# Patient Record
Sex: Female | Born: 1955 | Race: White | Hispanic: No | Marital: Married | Smoking: Never smoker
Health system: Southern US, Community
[De-identification: ages and names within clinical notes are randomized; demographics above are authoritative.]

## PROBLEM LIST (undated history)

## (undated) DIAGNOSIS — E7800 Pure hypercholesterolemia, unspecified: Secondary | ICD-10-CM

## (undated) HISTORY — PX: CHOLECYSTECTOMY: SHX55

---

## 2004-09-05 ENCOUNTER — Ambulatory Visit: Payer: Self-pay | Admitting: Internal Medicine

## 2007-09-23 ENCOUNTER — Ambulatory Visit: Payer: Self-pay | Admitting: Family Medicine

## 2007-11-29 ENCOUNTER — Ambulatory Visit: Payer: Self-pay | Admitting: Gastroenterology

## 2008-09-29 ENCOUNTER — Ambulatory Visit: Payer: Self-pay | Admitting: Internal Medicine

## 2009-09-30 ENCOUNTER — Ambulatory Visit: Payer: Self-pay | Admitting: Internal Medicine

## 2010-10-13 ENCOUNTER — Ambulatory Visit: Payer: Self-pay | Admitting: Internal Medicine

## 2011-02-03 ENCOUNTER — Ambulatory Visit: Payer: Self-pay | Admitting: Gastroenterology

## 2011-10-25 ENCOUNTER — Ambulatory Visit: Payer: Self-pay | Admitting: Internal Medicine

## 2011-11-07 ENCOUNTER — Ambulatory Visit: Payer: Self-pay | Admitting: Internal Medicine

## 2013-06-17 ENCOUNTER — Ambulatory Visit: Payer: Self-pay | Admitting: Nurse Practitioner

## 2014-07-09 ENCOUNTER — Ambulatory Visit: Payer: Self-pay | Admitting: Nurse Practitioner

## 2014-07-20 ENCOUNTER — Ambulatory Visit: Payer: Self-pay | Admitting: Nurse Practitioner

## 2015-11-16 ENCOUNTER — Encounter: Payer: Self-pay | Admitting: Gynecology

## 2015-11-16 ENCOUNTER — Ambulatory Visit
Admission: EM | Admit: 2015-11-16 | Discharge: 2015-11-16 | Disposition: A | Discharge status: Home / Self Care | Payer: Managed Care, Other (non HMO) | Attending: Family Medicine | Admitting: Family Medicine

## 2015-11-16 DIAGNOSIS — J020 Streptococcal pharyngitis: Secondary | ICD-10-CM | POA: Diagnosis not present

## 2015-11-16 HISTORY — DX: Pure hypercholesterolemia, unspecified: E78.00

## 2015-11-16 LAB — RAPID INFLUENZA A&B ANTIGENS: Influenza B (ARMC): NEGATIVE

## 2015-11-16 LAB — RAPID INFLUENZA A&B ANTIGENS (ARMC ONLY): INFLUENZA A (ARMC): NEGATIVE

## 2015-11-16 LAB — RAPID STREP SCREEN (MED CTR MEBANE ONLY): Streptococcus, Group A Screen (Direct): POSITIVE — AB

## 2015-11-16 MED ORDER — PENICILLIN G BENZATHINE 1200000 UNIT/2ML IM SUSP
1.2000 10*6.[IU] | Freq: Once | INTRAMUSCULAR | Status: AC
  Administered 2015-11-16: 1.2 10*6.[IU] via INTRAMUSCULAR

## 2015-11-16 NOTE — ED Notes (Signed)
Patient c/o sore throat on and off x 3 days ago. Patient c/o fever and head ache.

## 2015-11-16 NOTE — ED Provider Notes (Signed)
CSN: [id number]     Arrival date & time 11/16/15  1206 History   First MD Initiated Contact with Patient 11/16/15 1314    Nurses notes were reviewed. Chief Complaint  Patient presents with  . Sore Throat    Patient reports Friday sore throat headache and some chills. She's felt miserable since then and is controlling worse progressively. She is not allergic to any medication she is on Zocor for hyperlipidemia. Does not smoke and no pertinent family medical history related to this visit.     (Consider location/radiation/quality/duration/timing/severity/associated sxs/prior Treatment) Patient is a 60 y.o. female presenting with pharyngitis. No language interpreter was used.  Sore Throat This is a new problem. The current episode started more than 2 days ago. The problem occurs constantly. The problem has been gradually worsening. Associated symptoms include headaches. Pertinent negatives include no chest pain, no abdominal pain and no shortness of breath. Nothing aggravates the symptoms. Nothing relieves the symptoms. She has tried nothing for the symptoms.    Past Medical History  Diagnosis Date  . High cholesterol    Past Surgical History  Procedure Laterality Date  . Cholecystectomy     No family history on file. Social History  Substance Use Topics  . Smoking status: Never Smoker   . Smokeless tobacco: None  . Alcohol Use: No   OB History    No data available     Review of Systems  Constitutional: Positive for fever, chills and fatigue.  Respiratory: Negative for shortness of breath.   Cardiovascular: Negative for chest pain.  Gastrointestinal: Negative for abdominal pain.  Musculoskeletal: Positive for myalgias.  Neurological: Positive for headaches.  All other systems reviewed and are negative.   Allergies  Latex  Home Medications   Prior to Admission medications   Medication Sig Start Date End Date Taking? Authorizing Provider  simvastatin (ZOCOR) 5 MG  tablet Take 5 mg by mouth daily.   Yes Historical Provider, MD   Meds Ordered and Administered this Visit   Medications  penicillin g benzathine (BICILLIN LA) 1200000 UNIT/2ML injection 1.2 Million Units (1.2 Million Units Intramuscular Given 11/16/15 1351)    BP 140/74 mmHg  Temp(Src) 99.1 F (37.3 C)  Ht 5\' 4"  (1.626 m)  Wt 170 lb (77.111 kg)  BMI 29.17 kg/m2  SpO2 99% No data found.   Physical Exam  Constitutional: She is oriented to person, place, and time. She appears well-developed and well-nourished.  HENT:  Head: Normocephalic and atraumatic.  Right Ear: Hearing, tympanic membrane, external ear and ear canal normal.  Left Ear: Hearing, tympanic membrane, external ear and ear canal normal.  Nose: Mucosal edema present.  Mouth/Throat: No dental abscesses or dental caries. Posterior oropharyngeal edema and posterior oropharyngeal erythema present.  Eyes: Conjunctivae are normal. Pupils are equal, round, and reactive to light.  Neck: Normal range of motion.  Cardiovascular: Normal rate and regular rhythm.   Pulmonary/Chest: Effort normal.  Musculoskeletal: Normal range of motion.  Neurological: She is oriented to person, place, and time.  Skin: Skin is warm and dry.  Psychiatric: She has a normal mood and affect.  Vitals reviewed.   ED Course  Procedures (including critical care time)  Labs Review Labs Reviewed  RAPID STREP SCREEN (NOT AT [facility]) - Abnormal; Notable for the following:    Streptococcus, Group A Screen (Direct) POSITIVE (*)    All other components within normal limits  RAPID INFLUENZA A&B ANTIGENS ([facility] ONLY)    Imaging Review No results found.  Visual Acuity Review  Right Eye Distance:   Left Eye Distance:   Bilateral Distance:    Right Eye Near:   Left Eye Near:    Bilateral Near:      Results for orders placed or performed during the hospital encounter of 11/16/15  Rapid strep screen  Result Value Ref Range   Streptococcus, Group A  Screen (Direct) POSITIVE (A) NEGATIVE  Rapid Influenza A&B Antigens ([facility] only)  Result Value Ref Range   Influenza A ([facility]) NEGATIVE NEGATIVE   Influenza B ([facility]) NEGATIVE NEGATIVE     MDM   1. Strep sore throat     Scans patient options she elected to go with the injection Bicillin will Mr. 1.2 million units recommend repeat strep test in 2 weeks since she has had trouble with recurrent strep infections before if any question follow-up PCP in 2 weeks for repeat strep test. We'll also give a work note for today and tomorrow as well.   Note: This dictation was prepared with Dragon dictation along with smaller phrase technology. Any transcriptional errors that result from this process are unintentional.    Frederich Cha, MD 11/16/15 1401

## 2015-11-16 NOTE — Discharge Instructions (Signed)
Pharyngitis Pharyngitis is a sore throat (pharynx). There is redness, pain, and swelling of your throat. HOME CARE   Drink enough fluids to keep your pee (urine) clear or pale yellow.  Only take medicine as told by your doctor.  You may get sick again if you do not take medicine as told. Finish your medicines, even if you start to feel better.  Do not take aspirin.  Rest.  Rinse your mouth (gargle) with salt water ( tsp of salt per 1 qt of water) every 1-2 hours. This will help the pain.  If you are not at risk for choking, you can suck on hard candy or sore throat lozenges. GET HELP IF:  You have large, tender lumps on your neck.  You have a rash.  You cough up green, yellow-brown, or bloody spit. GET HELP RIGHT AWAY IF:   You have a stiff neck.  You drool or cannot swallow liquids.  You throw up (vomit) or are not able to keep medicine or liquids down.  You have very bad pain that does not go away with medicine.  You have problems breathing (not from a stuffy nose). MAKE SURE YOU:   Understand these instructions.  Will watch your condition.  Will get help right away if you are not doing well or get worse.   This information is not intended to replace advice given to you by your health care provider. Make sure you discuss any questions you have with your health care provider.   Document Released: 12/06/2007 Document Revised: 04/09/2013 Document Reviewed: 02/24/2013 Elsevier Interactive Patient Education 2016 Elsevier [employer]  Strep Throat Strep throat is an infection of the throat. It is caused by germs. Strep throat spreads from person to person because of coughing, sneezing, or close contact. HOME CARE Medicines  Take over-the-counter and prescription medicines only as told by your doctor.  Take your antibiotic medicine as told by your doctor. Do not stop taking the medicine even if you feel better.  Have family members who also have a sore throat or fever  go to a doctor. Eating and Drinking  Do not share food, drinking cups, or personal items.  Try eating soft foods until your sore throat feels better.  Drink enough fluid to keep your pee (urine) clear or pale yellow. General Instructions  Rinse your mouth (gargle) with a salt-water mixture 3-4 times per day or as needed. To make a salt-water mixture, stir -1 tsp of salt into 1 cup of warm water.  Make sure that all people in your house wash their hands well.  Rest.  Stay home from school or work until you have been taking antibiotics for 24 hours.  Keep all follow-up visits as told by your doctor. This is important. GET HELP IF:  Your neck keeps getting bigger.  You get a rash, cough, or earache.  You cough up thick liquid that is green, yellow-brown, or bloody.  You have pain that does not get better with medicine.  Your problems get worse instead of getting better.  You have a fever. GET HELP RIGHT AWAY IF:  You throw up (vomit).  You get a very bad headache.  You neck hurts or it feels stiff.  You have chest pain or you are short of breath.  You have drooling, very bad throat pain, or changes in your voice.  Your neck is swollen or the skin gets red and tender.  Your mouth is dry or you are peeing less than  normal.  You keep feeling more tired or it is hard to wake up.  Your joints are red or they hurt.   This information is not intended to replace advice given to you by your health care provider. Make sure you discuss any questions you have with your health care provider.   Document Released: 12/06/2007 Document Revised: 03/10/2015 Document Reviewed: 10/12/2014 Elsevier Interactive Patient Education [employer].

## 2016-04-10 ENCOUNTER — Other Ambulatory Visit: Payer: Self-pay | Admitting: Nurse Practitioner

## 2016-04-10 ENCOUNTER — Other Ambulatory Visit: Payer: Self-pay | Admitting: *Deleted

## 2016-04-10 DIAGNOSIS — Z1231 Encounter for screening mammogram for malignant neoplasm of breast: Secondary | ICD-10-CM

## 2016-05-05 ENCOUNTER — Ambulatory Visit
Admission: RE | Admit: 2016-05-05 | Discharge: 2016-05-05 | Disposition: A | Discharge status: Home / Self Care | Payer: Managed Care, Other (non HMO) | Source: Ambulatory Visit | Attending: Nurse Practitioner | Admitting: Nurse Practitioner

## 2016-05-05 DIAGNOSIS — Z1231 Encounter for screening mammogram for malignant neoplasm of breast: Secondary | ICD-10-CM | POA: Insufficient documentation

## 2016-09-29 ENCOUNTER — Ambulatory Visit
Admission: EM | Admit: 2016-09-29 | Discharge: 2016-09-29 | Disposition: A | Discharge status: Home / Self Care | Payer: Managed Care, Other (non HMO) | Attending: Family Medicine | Admitting: Family Medicine

## 2016-09-29 DIAGNOSIS — S60410A Abrasion of right index finger, initial encounter: Secondary | ICD-10-CM | POA: Diagnosis not present

## 2016-09-29 DIAGNOSIS — Z23 Encounter for immunization: Secondary | ICD-10-CM

## 2016-09-29 DIAGNOSIS — S60419A Abrasion of unspecified finger, initial encounter: Secondary | ICD-10-CM

## 2016-09-29 MED ORDER — TETANUS-DIPHTH-ACELL PERTUSSIS 5-2.5-18.5 LF-MCG/0.5 IM SUSP
0.5000 mL | Freq: Once | INTRAMUSCULAR | Status: AC
  Administered 2016-09-29: 0.5 mL via INTRAMUSCULAR

## 2016-09-29 NOTE — ED Provider Notes (Signed)
[facility]    CSN: [id number] Arrival date & time: 09/29/16  1258     History   Chief Complaint No chief complaint on file.   HPI Jasmine Cervantes is a 61 y.o. female.   61 yo female with a c/o a skin avulsion to her right index finger yesterday (24 hours ago) while using her mixer. The mixer was coming down and yanked her finger. Patient does not recall her last tetanus immunization.    The history is provided by the patient.    Past Medical History:  Diagnosis Date  . High cholesterol     There are no active problems to display for this patient.   Past Surgical History:  Procedure Laterality Date  . CHOLECYSTECTOMY      OB History    No data available       Home Medications    Prior to Admission medications   Medication Sig Start Date End Date Taking? Authorizing Provider  simvastatin (ZOCOR) 5 MG tablet Take 5 mg by mouth daily.    Historical Provider, MD    Family History Family History  Problem Relation Age of Onset  . Breast cancer Paternal Aunt     Social History Social History  Substance Use Topics  . Smoking status: Never Smoker  . Smokeless tobacco: Never Used  . Alcohol use No     Allergies   Latex   Review of Systems Review of Systems   Physical Exam Triage Vital Signs ED Triage Vitals  Enc Vitals Group     BP 09/29/16 1310 128/80     Pulse Rate 09/29/16 1310 82     Resp 09/29/16 1310 17     Temp 09/29/16 1310 97.8 F (36.6 C)     Temp Source 09/29/16 1310 Oral     SpO2 09/29/16 1310 100 %     Weight 09/29/16 1308 165 lb (74.8 kg)     Height 09/29/16 1308 5\' 4"  (1.626 m)     Head Circumference --      Peak Flow --      Pain Score 09/29/16 1308 0     Pain Loc --      Pain Edu? --      Excl. in [place]? --    No data found.   Updated Vital Signs BP 128/80 (BP Location: Left Arm)   Pulse 82   Temp 97.8 F (36.6 C) (Oral)   Resp 17   Ht 5\' 4"  (1.626 m)   Wt 165 lb (74.8 kg)   SpO2 100%   BMI  28.32 kg/m   Visual Acuity Right Eye Distance:   Left Eye Distance:   Bilateral Distance:    Right Eye Near:   Left Eye Near:    Bilateral Near:     Physical Exam  Constitutional: She appears well-developed and well-nourished. No distress.  Musculoskeletal:       Right hand: She exhibits normal range of motion, no tenderness, no bony tenderness, normal two-point discrimination, normal capillary refill, no deformity, no laceration and no swelling. Normal sensation noted. Normal strength noted.       Hands: Skin approx 1cm superficial skin avulsion to right index finger palmar aspect with avulsed skin missing; not actively bleeding; normal range of motion and strength of finger.  Skin: She is not diaphoretic.  Nursing note and vitals reviewed.    UC Treatments / Results  Labs (all labs ordered are listed, but only abnormal results are displayed)  Labs Reviewed - No data to display  EKG  EKG Interpretation None       Radiology No results found.  Procedures Procedures (including critical care time)  Medications Ordered in UC Medications  Tdap (BOOSTRIX) injection 0.5 mL (0.5 mLs Intramuscular Given 09/29/16 1318)     Initial Impression / Assessment and Plan / UC Course  I have reviewed the triage vital signs and the nursing notes.  Pertinent labs & imaging results that were available during my care of the patient were reviewed by me and considered in my medical decision making (see chart for details).       Final Clinical Impressions(s) / UC Diagnoses   Final diagnoses:  Abrasion of finger, initial encounter    New Prescriptions New Prescriptions   No medications on file   1. diagnosis reviewed with patient 2. Tetanus vaccine given 3. Recommend supportive treatment with general wound care (verbal and printed information given) 4. Follow-up prn if symptoms worsen or don't improve   Norval Gable, MD 09/29/16 1351

## 2016-09-29 NOTE — ED Triage Notes (Signed)
Patient complains avulsion on right index finger. Patient states that this occurred yesterday while using her mixer when the mixer was coming down and she yanked her finger. Patient states that this occurred around 2:15pm. Patient states that area was still bleeding this morning.

## 2017-04-11 ENCOUNTER — Other Ambulatory Visit: Payer: Self-pay | Admitting: Obstetrics and Gynecology

## 2017-04-11 DIAGNOSIS — Z1231 Encounter for screening mammogram for malignant neoplasm of breast: Secondary | ICD-10-CM

## 2017-05-07 ENCOUNTER — Ambulatory Visit
Admission: RE | Admit: 2017-05-07 | Discharge: 2017-05-07 | Disposition: A | Discharge status: Home / Self Care | Payer: 59 | Source: Ambulatory Visit | Attending: Obstetrics and Gynecology | Admitting: Obstetrics and Gynecology

## 2017-05-07 DIAGNOSIS — Z1231 Encounter for screening mammogram for malignant neoplasm of breast: Secondary | ICD-10-CM | POA: Diagnosis not present

## 2018-04-16 ENCOUNTER — Other Ambulatory Visit: Payer: Self-pay | Admitting: Obstetrics and Gynecology

## 2018-04-16 DIAGNOSIS — Z1231 Encounter for screening mammogram for malignant neoplasm of breast: Secondary | ICD-10-CM

## 2018-05-08 ENCOUNTER — Ambulatory Visit: Payer: 59

## 2018-05-16 ENCOUNTER — Encounter (INDEPENDENT_AMBULATORY_CARE_PROVIDER_SITE_OTHER): Payer: Self-pay

## 2018-05-16 ENCOUNTER — Ambulatory Visit
Admission: RE | Admit: 2018-05-16 | Discharge: 2018-05-16 | Disposition: A | Discharge status: Home / Self Care | Payer: 59 | Source: Ambulatory Visit | Attending: Obstetrics and Gynecology | Admitting: Obstetrics and Gynecology

## 2018-05-16 DIAGNOSIS — Z1231 Encounter for screening mammogram for malignant neoplasm of breast: Secondary | ICD-10-CM | POA: Diagnosis present

## 2018-06-12 ENCOUNTER — Other Ambulatory Visit: Payer: Self-pay | Admitting: Neurology

## 2018-06-12 DIAGNOSIS — G43019 Migraine without aura, intractable, without status migrainosus: Secondary | ICD-10-CM

## 2018-07-02 ENCOUNTER — Ambulatory Visit
Admission: RE | Admit: 2018-07-02 | Discharge: 2018-07-02 | Disposition: A | Discharge status: Home / Self Care | Payer: 59 | Source: Ambulatory Visit | Attending: Neurology | Admitting: Neurology

## 2018-07-02 DIAGNOSIS — G43019 Migraine without aura, intractable, without status migrainosus: Secondary | ICD-10-CM | POA: Diagnosis not present

## 2018-07-02 LAB — POCT I-STAT CREATININE: CREATININE: 0.8 mg/dL (ref 0.44–1.00)

## 2018-07-02 MED ORDER — GADOBUTROL 1 MMOL/ML IV SOLN
7.5000 mL | Freq: Once | INTRAVENOUS | Status: AC | PRN
  Administered 2018-07-02: 7.5 mL via INTRAVENOUS

## 2018-08-19 ENCOUNTER — Ambulatory Visit
Admission: RE | Admit: 2018-08-19 | Discharge: 2018-08-19 | Disposition: A | Discharge status: Home / Self Care | Payer: BLUE CROSS/BLUE SHIELD | Attending: Gastroenterology | Admitting: Gastroenterology

## 2018-08-19 ENCOUNTER — Encounter
Admission: RE | Disposition: A | Discharge status: Home / Self Care | Payer: Self-pay | Source: Home / Self Care | Attending: Gastroenterology

## 2018-08-19 ENCOUNTER — Encounter: Payer: Self-pay | Admitting: Anesthesiology

## 2018-08-19 ENCOUNTER — Ambulatory Visit: Payer: BLUE CROSS/BLUE SHIELD | Admitting: Anesthesiology

## 2018-08-19 DIAGNOSIS — D124 Benign neoplasm of descending colon: Secondary | ICD-10-CM | POA: Diagnosis not present

## 2018-08-19 DIAGNOSIS — K5730 Diverticulosis of large intestine without perforation or abscess without bleeding: Secondary | ICD-10-CM | POA: Insufficient documentation

## 2018-08-19 DIAGNOSIS — Z8371 Family history of colonic polyps: Secondary | ICD-10-CM | POA: Insufficient documentation

## 2018-08-19 DIAGNOSIS — Z86010 Personal history of colonic polyps: Secondary | ICD-10-CM | POA: Insufficient documentation

## 2018-08-19 DIAGNOSIS — E7800 Pure hypercholesterolemia, unspecified: Secondary | ICD-10-CM | POA: Insufficient documentation

## 2018-08-19 DIAGNOSIS — Z79899 Other long term (current) drug therapy: Secondary | ICD-10-CM | POA: Diagnosis not present

## 2018-08-19 DIAGNOSIS — Z09 Encounter for follow-up examination after completed treatment for conditions other than malignant neoplasm: Secondary | ICD-10-CM | POA: Diagnosis present

## 2018-08-19 DIAGNOSIS — Z7951 Long term (current) use of inhaled steroids: Secondary | ICD-10-CM | POA: Diagnosis not present

## 2018-08-19 HISTORY — PX: COLONOSCOPY WITH PROPOFOL: SHX5780

## 2018-08-19 SURGERY — COLONOSCOPY WITH PROPOFOL
Anesthesia: General

## 2018-08-19 MED ORDER — PROPOFOL 10 MG/ML IV BOLUS
INTRAVENOUS | Status: AC
  Filled 2018-08-19: qty 20

## 2018-08-19 MED ORDER — LIDOCAINE HCL (CARDIAC) PF 100 MG/5ML IV SOSY
PREFILLED_SYRINGE | INTRAVENOUS | Status: DC | PRN
  Administered 2018-08-19: 60 mg via INTRATRACHEAL

## 2018-08-19 MED ORDER — PHENYLEPHRINE HCL 10 MG/ML IJ SOLN
INTRAMUSCULAR | Status: DC | PRN
  Administered 2018-08-19 (×4): 100 ug via INTRAVENOUS
  Administered 2018-08-19 (×2): 50 ug via INTRAVENOUS

## 2018-08-19 MED ORDER — PROPOFOL 500 MG/50ML IV EMUL
INTRAVENOUS | Status: AC
  Filled 2018-08-19: qty 50

## 2018-08-19 MED ORDER — PROPOFOL 500 MG/50ML IV EMUL
INTRAVENOUS | Status: DC | PRN
  Administered 2018-08-19: 100 ug/kg/min via INTRAVENOUS

## 2018-08-19 MED ORDER — LIDOCAINE HCL (PF) 2 % IJ SOLN
INTRAMUSCULAR | Status: AC
  Filled 2018-08-19: qty 10

## 2018-08-19 MED ORDER — SODIUM CHLORIDE 0.9 % IV SOLN
INTRAVENOUS | Status: DC
  Administered 2018-08-19: 1000 mL via INTRAVENOUS

## 2018-08-19 MED ORDER — PROPOFOL 10 MG/ML IV BOLUS
INTRAVENOUS | Status: DC | PRN
  Administered 2018-08-19: 10 mg via INTRAVENOUS
  Administered 2018-08-19 (×2): 20 mg via INTRAVENOUS
  Administered 2018-08-19: 50 mg via INTRAVENOUS

## 2018-08-19 MED ORDER — PHENYLEPHRINE HCL 10 MG/ML IJ SOLN
INTRAMUSCULAR | Status: AC
  Filled 2018-08-19: qty 1

## 2018-08-19 NOTE — Transfer of Care (Signed)
Immediate Anesthesia Transfer of Care Note  Patient: Jasmine Cervantes  Procedure(s) Performed: COLONOSCOPY WITH PROPOFOL (N/A )  Patient Location: PACU  Anesthesia Type:General  Level of Consciousness: awake  Airway & Oxygen Therapy: Patient Spontanous Breathing and Patient connected to nasal cannula oxygen  Post-op Assessment: Report given to RN and Post -op Vital signs reviewed and stable  Post vital signs: stable  Last Vitals:  Vitals Value Taken Time  BP 100/67 08/19/2018  8:37 AM  Temp 36.2 C 08/19/2018  8:37 AM  Pulse 71 08/19/2018  8:37 AM  Resp 17 08/19/2018  8:37 AM  SpO2 100 % 08/19/2018  8:37 AM    Last Pain:  Vitals:   08/19/18 0837  TempSrc: Tympanic  PainSc: 0-No pain         Complications: No apparent anesthesia complications

## 2018-08-19 NOTE — Op Note (Addendum)
[facility] Patient Name: Jasmine Cervantes Procedure Date: 08/19/2018 7:25 AM MRN: [id number] Account #: [id number] Date of Birth: [birth year] Admit Type: Outpatient Age: 62 Room: [facility] ENDO ROOM 1 Gender: Female Note Status: Finalized Procedure:            Colonoscopy Indications:          Family history of colonic polyps in a first-degree                        relative, Personal history of colonic polyps Providers:            Lollie Sails, MD Medicines:            Monitored Anesthesia Care Complications:        No immediate complications. Procedure:            Pre-Anesthesia Assessment:                       - ASA Grade Assessment: III - A patient with severe                        systemic disease.                       After obtaining informed consent, the colonoscope was                        passed under direct vision. Throughout the procedure,                        the patient's blood pressure, pulse, and oxygen                        saturations were monitored continuously. The                        Colonoscope was introduced through the anus and                        advanced to the the cecum, identified by appendiceal                        orifice and ileocecal valve. The colonoscopy was                        performed without difficulty. The patient tolerated the                        procedure well. The quality of the bowel preparation                        was good. Findings:      A 10 mm polyp was found in the descending colon. The polyp was sessile.       The polyp was removed with a cold biopsy forceps. The polyp was removed       with a cold snare. Resection and retrieval were complete.      Multiple small-mouthed diverticula were found in the sigmoid colon and       descending colon.      The retroflexed view of the distal rectum and anal verge was normal and  showed no anal or rectal abnormalities.      The  digital rectal exam was normal. Impression:           - One 10 mm polyp in the descending colon, removed with                        a cold biopsy forceps and removed with a cold snare.                        Resected and retrieved.                       - Diverticulosis in the sigmoid colon and in the                        descending colon.                       - The distal rectum and anal verge are normal on                        retroflexion view. Recommendation:       - Discharge patient to home.                       - Await pathology results.                       - Repeat colonoscopy in 3 years for adenoma                        surveillance. Procedure Code(s):    --- Professional ---                       [phone], Colonoscopy, flexible; with removal of tumor(s),                        polyp(s), or other lesion(s) by snare technique Diagnosis Code(s):    --- Professional ---                       D12.4, Benign neoplasm of descending colon                       Z83.71, Family history of colonic polyps                       Z86.010, Personal history of colonic polyps                       K57.30, Diverticulosis of large intestine without                        perforation or abscess without bleeding CPT copyright 2018 American Medical Association. All rights reserved. The codes documented in this report are preliminary and upon coder review may  be revised to meet current compliance requirements. Lollie Sails, MD 08/19/2018 8:34:03 AM This report has been signed electronically. Number of Addenda: 0 Note Initiated On: 08/19/2018 7:25 AM Scope Withdrawal Time: 0 hours 31 minutes 28 seconds  Total Procedure Duration: 0 hours 47 minutes 2 seconds       [facility]  Center

## 2018-08-19 NOTE — Anesthesia Post-op Follow-up Note (Signed)
Anesthesia QCDR form completed.        

## 2018-08-19 NOTE — H&P (Signed)
Outpatient short stay form Pre-procedure 08/19/2018 7:34 AM Lollie Sails MD  Primary Physician: Pauline Good, CNM  Reason for visit: Colonoscopy  History of present illness: Patient is a 63 year old female presenting today for colonoscopy in regards her personal history of colon polyps and family history of colon polyps in a primary relative.  Tolerated her prep well.  She takes no aspirin or blood thinning agent.    Current Facility-Administered Medications:  .  0.9 %  sodium chloride infusion, , Intravenous, Continuous, Lollie Sails, MD, Last Rate: 20 mL/hr at 08/19/18 0733, 1,000 mL at 08/19/18 1102  Medications Prior to Admission  Medication Sig Dispense Refill Last Dose  . simvastatin (ZOCOR) 5 MG tablet Take 5 mg by mouth daily.   Past Month at Unknown time  . triamcinolone (NASACORT) 55 MCG/ACT AERO nasal inhaler Place 2 sprays into the nose daily.   Past Month at Unknown time  . omega-3 acid ethyl esters (LOVAZA) 1 g capsule Take 1 g by mouth daily.   Not Taking at Unknown time     Allergies  Allergen Reactions  . Latex Itching     Past Medical History:  Diagnosis Date  . High cholesterol     Review of systems:      Physical Exam    Heart and lungs: Without rub or gallop, lungs are bilaterally clear.    HEENT: Normocephalic atraumatic eyes are anicteric    Other:    Pertinant exam for procedure: Soft nontender nondistended bowel sounds positive normoactive.    Planned proceedures: Colonoscopy and indicated procedures. I have discussed the risks benefits and complications of procedures to include not limited to bleeding, infection, perforation and the risk of sedation and the patient wishes to proceed.    Lollie Sails, MD Gastroenterology 08/19/2018  7:34 AM

## 2018-08-19 NOTE — Anesthesia Preprocedure Evaluation (Signed)
Anesthesia Evaluation  Patient identified by MRN, date of birth, ID band Patient awake    Reviewed: Allergy & Precautions, H&P , NPO status , Patient's Chart, lab work & pertinent test results  History of Anesthesia Complications Negative for: history of anesthetic complications  Airway Mallampati: III  TM Distance: <3 FB Neck ROM: limited    Dental  (+) Chipped   Pulmonary neg shortness of breath, asthma ,           Cardiovascular (-) angina(-) Past MI and (-) DOE      Neuro/Psych negative neurological ROS  negative psych ROS   GI/Hepatic negative GI ROS, Neg liver ROS, neg GERD  ,  Endo/Other  negative endocrine ROS  Renal/GU negative Renal ROS  negative genitourinary   Musculoskeletal   Abdominal   Peds  Hematology negative hematology ROS (+)   Anesthesia Other Findings Past Medical History: No date: High cholesterol  Past Surgical History: No date: CHOLECYSTECTOMY     Reproductive/Obstetrics negative OB ROS                             Anesthesia Physical Anesthesia Plan  ASA: III  Anesthesia Plan: General   Post-op Pain Management:    Induction: Intravenous  PONV Risk Score and Plan: Propofol infusion and TIVA  Airway Management Planned: Natural Airway and Nasal Cannula  Additional Equipment:   Intra-op Plan:   Post-operative Plan:   Informed Consent: I have reviewed the patients History and Physical, chart, labs and discussed the procedure including the risks, benefits and alternatives for the proposed anesthesia with the patient or authorized representative who has indicated his/her understanding and acceptance.     Dental Advisory Given  Plan Discussed with: Anesthesiologist, CRNA and Surgeon  Anesthesia Plan Comments: (Patient consented for risks of anesthesia including but not limited to:  - adverse reactions to medications - risk of intubation if  required - damage to teeth, lips or other oral mucosa - sore throat or hoarseness - Damage to heart, brain, lungs or loss of life  Patient voiced understanding.)        Anesthesia Quick Evaluation

## 2018-08-19 NOTE — Anesthesia Postprocedure Evaluation (Signed)
Anesthesia Post Note  Patient: Jasmine Cervantes  Procedure(s) Performed: COLONOSCOPY WITH PROPOFOL (N/A )  Patient location during evaluation: Endoscopy Anesthesia Type: General Level of consciousness: awake and alert Pain management: pain level controlled Vital Signs Assessment: post-procedure vital signs reviewed and stable Respiratory status: spontaneous breathing, nonlabored ventilation, respiratory function stable and patient connected to nasal cannula oxygen Cardiovascular status: blood pressure returned to baseline and stable Postop Assessment: no apparent nausea or vomiting Anesthetic complications: no     Last Vitals:  Vitals:   08/19/18 0857 08/19/18 0907  BP: 109/64 106/70  Pulse: 63 60  Resp: 15 13  Temp:    SpO2: 97% 96%    Last Pain:  Vitals:   08/19/18 0907  TempSrc:   PainSc: 0-No pain                 Precious Haws Piscitello

## 2018-08-20 ENCOUNTER — Encounter: Payer: Self-pay | Admitting: Gastroenterology

## 2018-08-20 LAB — SURGICAL PATHOLOGY

## 2019-05-12 ENCOUNTER — Other Ambulatory Visit: Payer: Self-pay | Admitting: Certified Nurse Midwife

## 2019-05-12 DIAGNOSIS — Z1231 Encounter for screening mammogram for malignant neoplasm of breast: Secondary | ICD-10-CM

## 2019-05-27 ENCOUNTER — Ambulatory Visit
Admission: RE | Admit: 2019-05-27 | Discharge: 2019-05-27 | Disposition: A | Discharge status: Home / Self Care | Payer: BC Managed Care – PPO | Source: Ambulatory Visit | Attending: Certified Nurse Midwife | Admitting: Certified Nurse Midwife

## 2019-05-27 ENCOUNTER — Other Ambulatory Visit: Payer: Self-pay

## 2019-05-27 DIAGNOSIS — Z1231 Encounter for screening mammogram for malignant neoplasm of breast: Secondary | ICD-10-CM | POA: Diagnosis present

## 2019-12-14 IMAGING — MR MR HEAD WO/W CM
12 series · 48 of 48 positions shown · IV contrast (gadavist)
Comparison: None.

CLINICAL DATA: Intractable migraine.

EXAM:
MRI HEAD WITHOUT AND WITH CONTRAST
TECHNIQUE: Multiplanar, multiecho pulse sequences of the brain and surrounding
structures were obtained without and with intravenous contrast.
CONTRAST:  7.5 cc Gadavist intravenous

[Series 2: T1 · sagittal · 5.0mm · 0.45mm/px · 1 of 23 slices shown (1 of 2)]
[im 1/23]
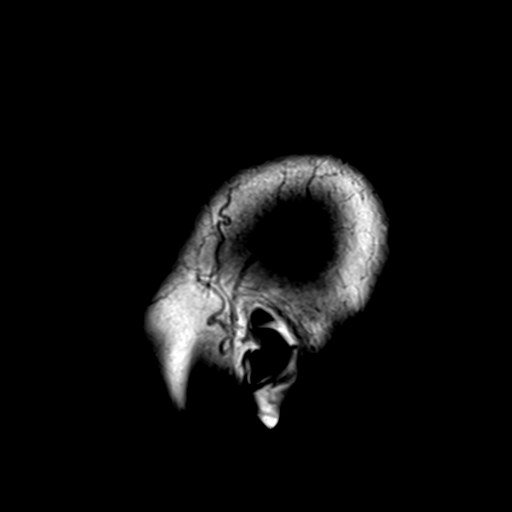

[Series 4: DWI · axial · 3.0mm · 1.20mm/px · z∈[-94,+68]mm · 3 of 55 slices shown (1 of 3)]
[im 1/55]
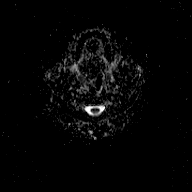
[im 28/55]
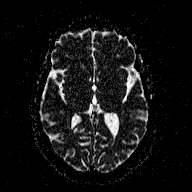
[im 55/55]
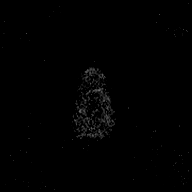

[Series 5: T2 · axial · 5.0mm · 0.72mm/px · z∈[-101,+67]mm · 2 of 25 slices shown (1 of 2)]
[im 1/25]
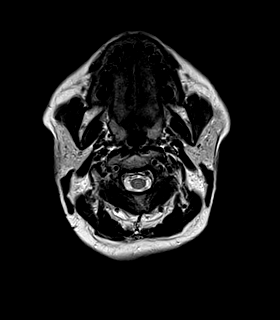
[im 25/25]
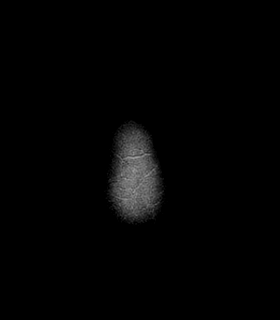

[Series 6: FLAIR · axial · 3.0mm · 0.45mm/px · z∈[-98,+64]mm · 4 of 55 slices shown]
[im 1/55]
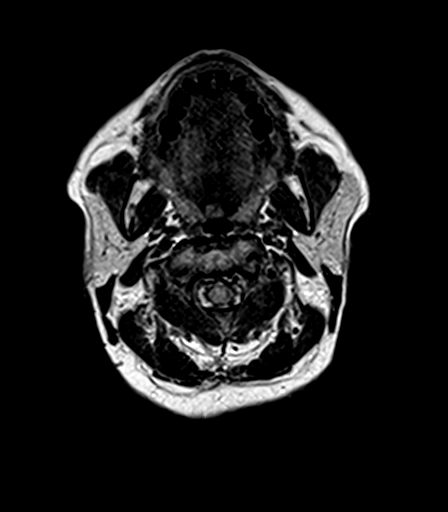
[im 19/55]
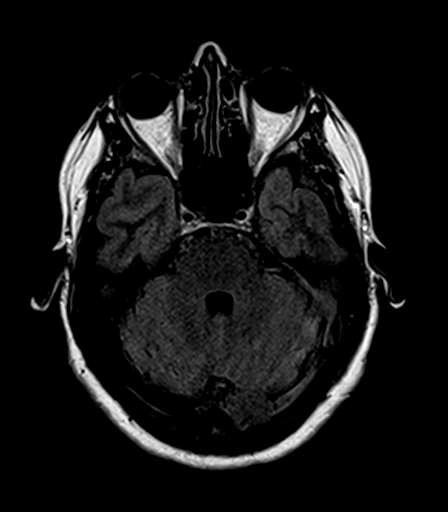
[im 37/55]
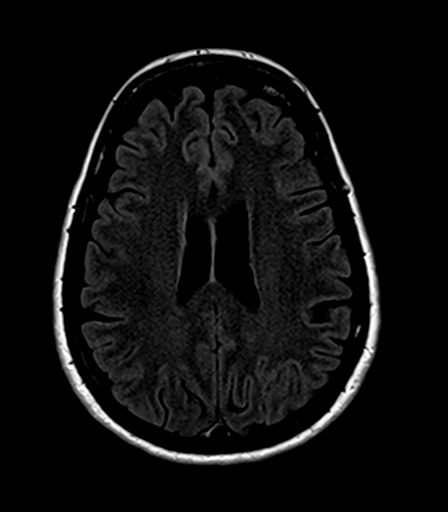
[im 55/55]
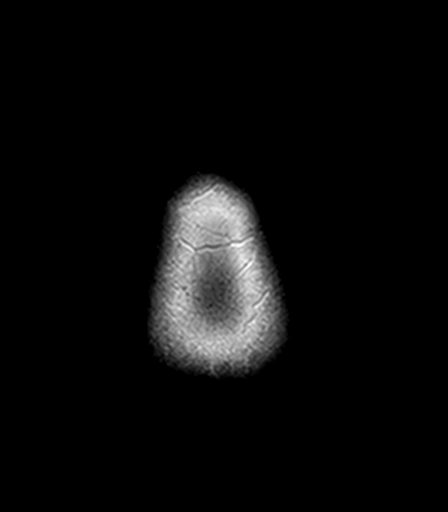

[Series 7: T2 · axial · 5.0mm · 0.72mm/px · z∈[-101,+67]mm · 2 of 25 slices shown (2 of 2)]
[im 1/25]
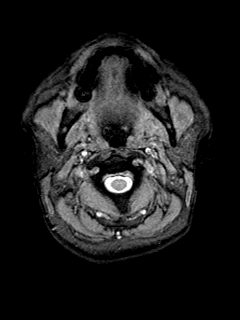
[im 25/25]
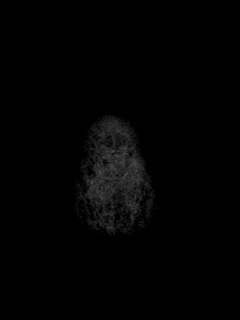

[Series 8: T1 · axial · 1.0mm · 1.00mm/px · z∈[-96,+63]mm · 11 of 160 slices shown (2 of 2)]
[im 1/160]
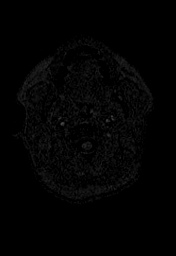
[im 16/160]
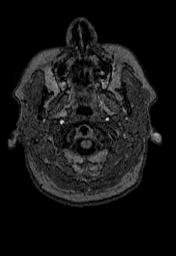
[im 32/160]
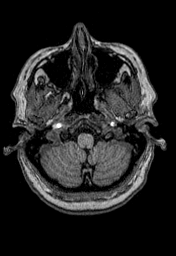
[im 48/160]
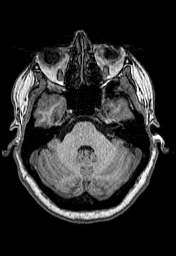
[im 64/160]
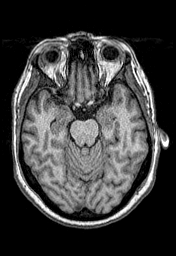
[im 80/160]
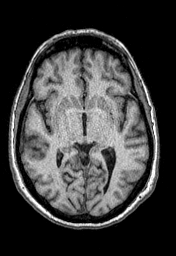
[im 96/160]
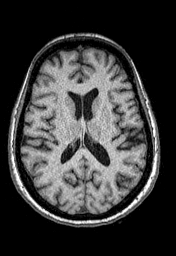
[im 112/160]
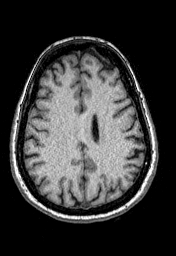
[im 128/160]
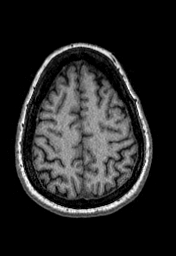
[im 144/160]
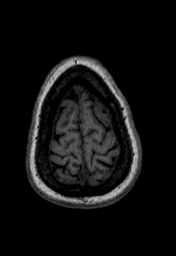
[im 160/160]
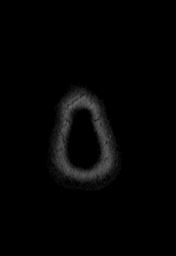

[Series 10: DWI · coronal · 3.0mm · 1.15mm/px · 3 of 48 slices shown (2 of 3)]
[im 1/48]
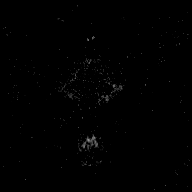
[im 24/48]
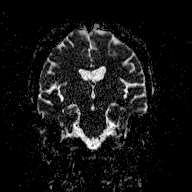
[im 48/48]
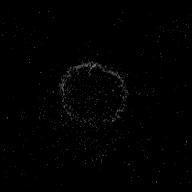

[Series 11: T2 post-contrast · coronal · 5.0mm · 0.43mm/px · 2 of 31 slices shown]
[im 1/31]
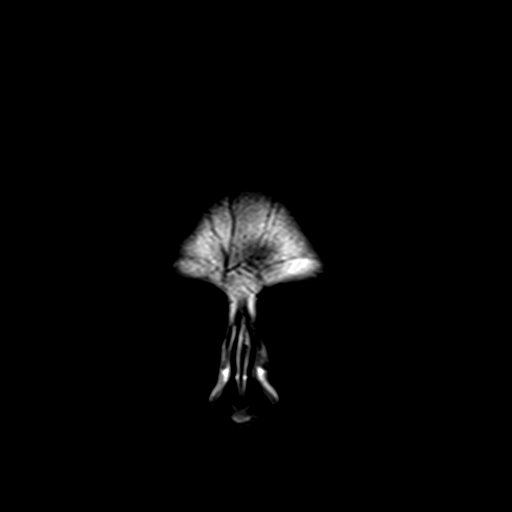
[im 31/31]
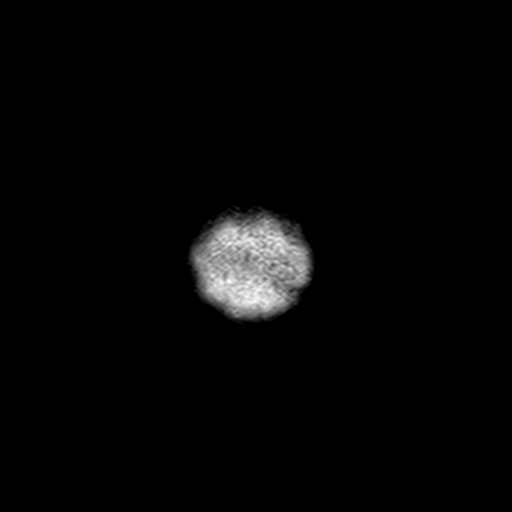

[Series 12: T1 post-contrast · axial · 1.0mm · 1.00mm/px · z∈[-96,+63]mm · 11 of 160 slices shown (1 of 2)]
[im 1/160]
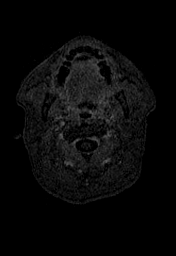
[im 16/160]
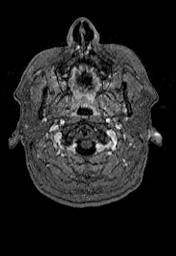
[im 32/160]
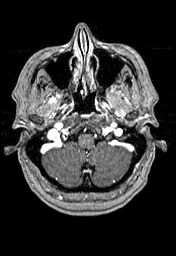
[im 48/160]
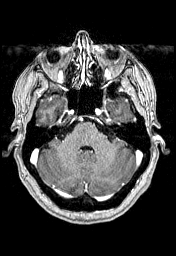
[im 64/160]
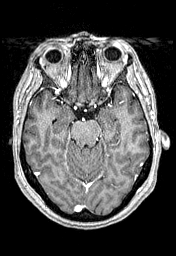
[im 80/160]
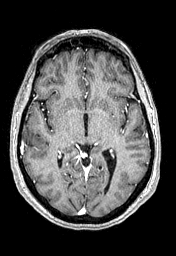
[im 96/160]
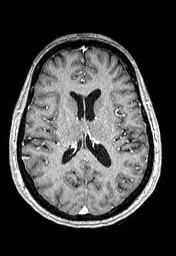
[im 112/160]
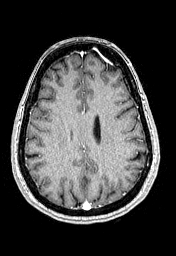
[im 128/160]
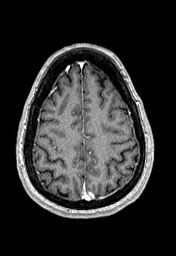
[im 144/160]
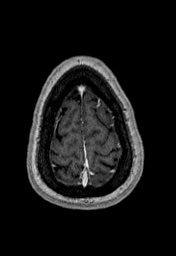
[im 160/160]
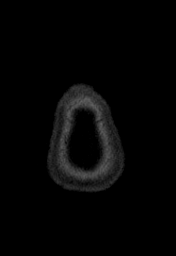

[Series 13: T1 post-contrast · coronal · 5.0mm · 0.43mm/px · 2 of 31 slices shown (2 of 2)]
[im 1/31]
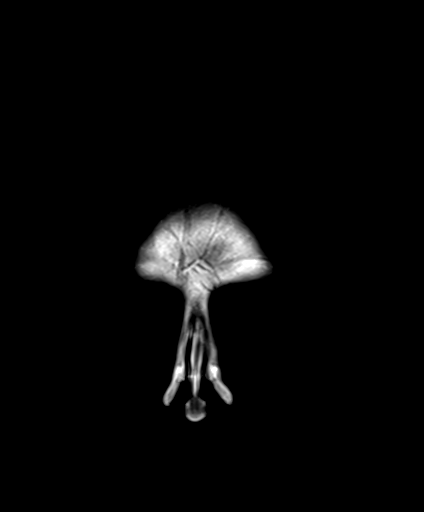
[im 31/31]
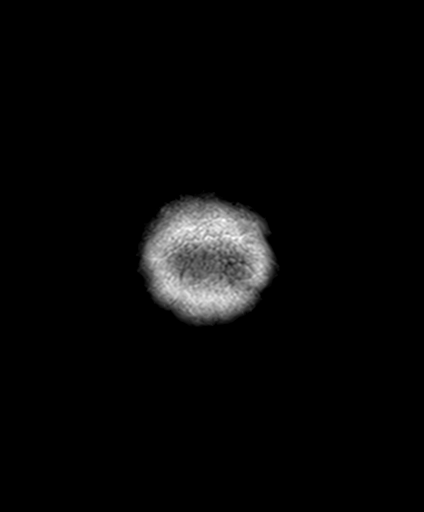

[Series 100: DWI · axial · 3.0mm · 1.20mm/px · z∈[-94,+68]mm · 4 of 55 slices shown (3 of 3)]
[im 1/55]
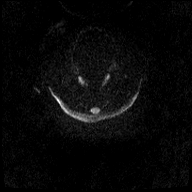
[im 19/55]
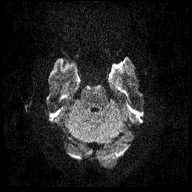
[im 37/55]
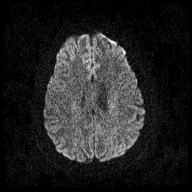
[im 55/55]
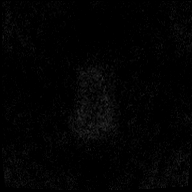

[Series 101: (id) cor · coronal · 3.0mm · 1.15mm/px · 3 of 47 slices shown]
[im 1/47]
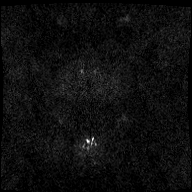
[im 24/47]
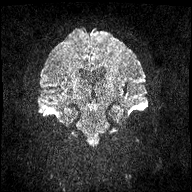
[im 47/47]
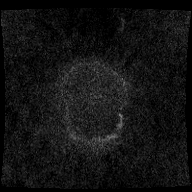

[48 of 48 positions shown; findings below may reference images not displayed]

FINDINGS: Brain: No infarction, hemorrhage, hydrocephalus, extra-axial
collection or mass lesion. No white matter disease or volume loss

Vascular: Normal flow voids vascular enhancements.

Skull and upper cervical spine: Normal marrow signal

Sinuses/Orbits: Negative
IMPRESSION: Normal brain MRI.

## 2020-11-11 ENCOUNTER — Other Ambulatory Visit: Payer: Self-pay | Admitting: Nurse Practitioner

## 2023-10-23 ENCOUNTER — Other Ambulatory Visit: Payer: Self-pay | Admitting: Internal Medicine

## 2023-10-23 DIAGNOSIS — Z1231 Encounter for screening mammogram for malignant neoplasm of breast: Secondary | ICD-10-CM

## 2023-10-23 DIAGNOSIS — Z13820 Encounter for screening for osteoporosis: Secondary | ICD-10-CM

## 2024-03-20 ENCOUNTER — Ambulatory Visit: Payer: Self-pay

## 2024-03-20 DIAGNOSIS — K640 First degree hemorrhoids: Secondary | ICD-10-CM | POA: Diagnosis not present

## 2024-03-20 DIAGNOSIS — Z09 Encounter for follow-up examination after completed treatment for conditions other than malignant neoplasm: Secondary | ICD-10-CM | POA: Diagnosis present

## 2024-03-20 DIAGNOSIS — Z860100 Personal history of colon polyps, unspecified: Secondary | ICD-10-CM | POA: Diagnosis not present

## 2024-03-20 DIAGNOSIS — D122 Benign neoplasm of ascending colon: Secondary | ICD-10-CM | POA: Diagnosis not present

## 2024-06-03 ENCOUNTER — Ambulatory Visit
Admission: RE | Admit: 2024-06-03 | Discharge: 2024-06-03 | Disposition: A | Source: Ambulatory Visit | Attending: Internal Medicine | Admitting: Internal Medicine

## 2024-06-03 DIAGNOSIS — Z13820 Encounter for screening for osteoporosis: Secondary | ICD-10-CM

## 2024-06-03 DIAGNOSIS — Z1231 Encounter for screening mammogram for malignant neoplasm of breast: Secondary | ICD-10-CM | POA: Insufficient documentation

## 2024-06-10 ENCOUNTER — Other Ambulatory Visit: Payer: Self-pay | Admitting: Internal Medicine

## 2024-06-10 DIAGNOSIS — N6489 Other specified disorders of breast: Secondary | ICD-10-CM

## 2024-06-16 ENCOUNTER — Inpatient Hospital Stay: Admission: RE | Admit: 2024-06-16 | Discharge: 2024-06-16 | Attending: Internal Medicine | Admitting: Internal Medicine

## 2024-06-16 DIAGNOSIS — N6489 Other specified disorders of breast: Secondary | ICD-10-CM | POA: Diagnosis present
